# Patient Record
Sex: Male | Born: 1970 | Race: White | Hispanic: No | Marital: Married | Smoking: Current every day smoker
Health system: Southern US, Community
[De-identification: ages and names within clinical notes are randomized; demographics above are authoritative.]

## PROBLEM LIST (undated history)

## (undated) ENCOUNTER — Emergency Department (HOSPITAL_COMMUNITY): Payer: Self-pay | Source: Home / Self Care

## (undated) DIAGNOSIS — J45909 Unspecified asthma, uncomplicated: Secondary | ICD-10-CM

## (undated) DIAGNOSIS — F1910 Other psychoactive substance abuse, uncomplicated: Secondary | ICD-10-CM

## (undated) DIAGNOSIS — F1010 Alcohol abuse, uncomplicated: Secondary | ICD-10-CM

## (undated) DIAGNOSIS — R011 Cardiac murmur, unspecified: Secondary | ICD-10-CM

## (undated) DIAGNOSIS — F419 Anxiety disorder, unspecified: Secondary | ICD-10-CM

## (undated) HISTORY — PX: NO PAST SURGERIES: SHX2092

---

## 2000-05-23 ENCOUNTER — Emergency Department (HOSPITAL_COMMUNITY): Admission: EM | Admit: 2000-05-23 | Discharge: 2000-05-23 | Payer: Self-pay | Admitting: Emergency Medicine

## 2002-05-25 ENCOUNTER — Emergency Department (HOSPITAL_COMMUNITY): Admission: EM | Admit: 2002-05-25 | Discharge: 2002-05-25 | Payer: Self-pay | Admitting: Emergency Medicine

## 2002-07-15 ENCOUNTER — Ambulatory Visit (HOSPITAL_COMMUNITY): Admission: RE | Admit: 2002-07-15 | Discharge: 2002-07-15 | Payer: Self-pay | Admitting: Gastroenterology

## 2002-07-18 ENCOUNTER — Ambulatory Visit (HOSPITAL_COMMUNITY): Admission: RE | Admit: 2002-07-18 | Discharge: 2002-07-18 | Payer: Self-pay | Admitting: Internal Medicine

## 2002-07-18 ENCOUNTER — Encounter: Payer: Self-pay | Admitting: Internal Medicine

## 2007-03-06 ENCOUNTER — Emergency Department (HOSPITAL_COMMUNITY): Admission: EM | Admit: 2007-03-06 | Discharge: 2007-03-07 | Payer: Self-pay | Admitting: *Deleted

## 2009-11-05 ENCOUNTER — Emergency Department (HOSPITAL_COMMUNITY): Admission: EM | Admit: 2009-11-05 | Discharge: 2009-11-07 | Payer: Self-pay | Admitting: Emergency Medicine

## 2009-11-07 ENCOUNTER — Ambulatory Visit: Payer: Self-pay | Admitting: Psychiatry

## 2009-11-07 ENCOUNTER — Inpatient Hospital Stay (HOSPITAL_COMMUNITY): Admission: AD | Admit: 2009-11-07 | Discharge: 2009-11-10 | Payer: Self-pay | Admitting: Psychiatry

## 2010-06-01 LAB — ETHANOL: Alcohol, Ethyl (B): <5 mg/dL (ref 0–10)

## 2010-06-01 LAB — HEPATIC FUNCTION PANEL
ALT: 39 U/L (ref 0–53)
AST: 49 U/L — ABNORMAL HIGH (ref 0–37)
Alkaline Phosphatase: 73 U/L (ref 39–117)
Total Protein: 7 g/dL (ref 6.0–8.3)

## 2010-06-01 LAB — DIFFERENTIAL
Basophils Absolute: 0 10*3/uL (ref 0.0–0.1)
Basophils Relative: 1 % (ref 0–1)
Monocytes Relative: 7 % (ref 3–12)
Neutro Abs: 6.3 10*3/uL (ref 1.7–7.7)
Neutrophils Relative %: 62 % (ref 43–77)

## 2010-06-01 LAB — BASIC METABOLIC PANEL
Calcium: 10.1 mg/dL (ref 8.4–10.5)
GFR calc Af Amer: >60 mL/min (ref >60)
GFR calc non Af Amer: >60 mL/min (ref >60)
Sodium: 142 mEq/L (ref 135–145)

## 2010-06-01 LAB — CBC
Hemoglobin: 14.9 g/dL (ref 13.0–17.0)
MCHC: 35.3 g/dL (ref 30.0–36.0)
Platelets: 301 10*3/uL (ref 150–400)
RDW: 13 % (ref 11.5–15.5)

## 2010-06-01 LAB — TSH: TSH: 2.576 u[IU]/mL (ref 0.350–4.500)

## 2010-06-01 LAB — ACETAMINOPHEN LEVEL: Acetaminophen (Tylenol), Serum: <10 ug/mL — ABNORMAL LOW (ref 10–30)

## 2010-06-01 LAB — RAPID URINE DRUG SCREEN, HOSP PERFORMED
Barbiturates: NOT DETECTED
Cocaine: NOT DETECTED
Opiates: NOT DETECTED

## 2010-06-01 LAB — SALICYLATE LEVEL: Salicylate Lvl: <4 mg/dL (ref 2.8–20.0)

## 2010-08-03 NOTE — Op Note (Signed)
   NAME:  Derrick Meyer, Derrick Meyer                         ACCOUNT NO.:  [id number]   MEDICAL RECORD NO.:  [id number]                   PATIENT TYPE:  AMB   LOCATION:  ENDO                                 FACILITY:  [facility]   PHYSICIAN:  Danise Edge, M.D.                DATE OF BIRTH:  1970-09-08   DATE OF PROCEDURE:  07/15/2002  DATE OF DISCHARGE:                                 OPERATIVE REPORT   REFERRING PHYSICIAN:  Ike Bene, M.D.   PROCEDURE PERFORMED:  Colonoscopy.   ENDOSCOPIST:  Charolett Bumpers, M.D.   INDICATIONS FOR PROCEDURE:  The patient is a 40 year old male born [birth year].  The patient is undergoing diagnostic colonoscopy to evaluate  guaiac positive stool associated with a normal hemoglobin.   PREMEDICATION:  Versed 10 mg, Demerol 50 mg.   DESCRIPTION OF PROCEDURE:  After obtaining informed consent, the patient was  placed in the left lateral decubitus position.  I administered intravenous  Demerol and intravenous Versed to achieve conscious sedation for the  procedure.  The patient's blood pressure, oxygen saturations and cardiac  rhythm were monitored throughout the procedure and documented in the medical  record.   Anal inspection was normal.  Digital rectal exam was normal.  The Olympus  adult colonoscope was introduced into the rectum and easily advanced to  cecum.  A normal-appearing ileocecal valve was intubated and the distal  ileum inspected.  Colonic preparation for the exam today was excellent.   Rectum:  Normal.   Sigmoid colon and descending colon:  Normal.   Splenic flexure:  Normal.   Transverse colon:  Normal.   Hepatic flexure:  Normal.   Ascending colon:  Normal.   Cecum and ileocecal valve:  Normal.   Distal ileum:  Normal.   ASSESSMENT:  Normal proctocolonoscopy including inspection of the distal  ileum.                                                   Danise Edge, M.D.    MJ/MEDQ  D:  07/15/2002  T:   07/15/2002  Job:  [id number]

## 2020-01-26 ENCOUNTER — Observation Stay (HOSPITAL_COMMUNITY)
Admission: EM | Admit: 2020-01-26 | Discharge: 2020-01-27 | Disposition: A | Discharge status: Home / Self Care | Payer: 59 | Attending: Internal Medicine | Admitting: Internal Medicine

## 2020-01-26 ENCOUNTER — Emergency Department (HOSPITAL_COMMUNITY): Payer: 59

## 2020-01-26 ENCOUNTER — Encounter (HOSPITAL_COMMUNITY): Payer: Self-pay

## 2020-01-26 DIAGNOSIS — R0789 Other chest pain: Principal | ICD-10-CM | POA: Insufficient documentation

## 2020-01-26 DIAGNOSIS — E785 Hyperlipidemia, unspecified: Secondary | ICD-10-CM | POA: Diagnosis not present

## 2020-01-26 DIAGNOSIS — R9431 Abnormal electrocardiogram [ECG] [EKG]: Secondary | ICD-10-CM | POA: Diagnosis not present

## 2020-01-26 DIAGNOSIS — J45909 Unspecified asthma, uncomplicated: Secondary | ICD-10-CM | POA: Insufficient documentation

## 2020-01-26 DIAGNOSIS — F1010 Alcohol abuse, uncomplicated: Secondary | ICD-10-CM | POA: Diagnosis not present

## 2020-01-26 DIAGNOSIS — R079 Chest pain, unspecified: Secondary | ICD-10-CM | POA: Diagnosis not present

## 2020-01-26 DIAGNOSIS — Z7289 Other problems related to lifestyle: Secondary | ICD-10-CM

## 2020-01-26 DIAGNOSIS — F1090 Alcohol use, unspecified, uncomplicated: Secondary | ICD-10-CM

## 2020-01-26 DIAGNOSIS — Z20822 Contact with and (suspected) exposure to covid-19: Secondary | ICD-10-CM | POA: Diagnosis not present

## 2020-01-26 DIAGNOSIS — F17210 Nicotine dependence, cigarettes, uncomplicated: Secondary | ICD-10-CM | POA: Diagnosis not present

## 2020-01-26 DIAGNOSIS — I10 Essential (primary) hypertension: Secondary | ICD-10-CM | POA: Diagnosis not present

## 2020-01-26 DIAGNOSIS — Z79899 Other long term (current) drug therapy: Secondary | ICD-10-CM | POA: Insufficient documentation

## 2020-01-26 DIAGNOSIS — Z720 Tobacco use: Secondary | ICD-10-CM

## 2020-01-26 DIAGNOSIS — I160 Hypertensive urgency: Secondary | ICD-10-CM

## 2020-01-26 DIAGNOSIS — Z789 Other specified health status: Secondary | ICD-10-CM

## 2020-01-26 DIAGNOSIS — F419 Anxiety disorder, unspecified: Secondary | ICD-10-CM | POA: Diagnosis not present

## 2020-01-26 HISTORY — DX: Anxiety disorder, unspecified: F41.9

## 2020-01-26 HISTORY — DX: Cardiac murmur, unspecified: R01.1

## 2020-01-26 HISTORY — DX: Alcohol abuse, uncomplicated: F10.10

## 2020-01-26 HISTORY — DX: Other psychoactive substance abuse, uncomplicated: F19.10

## 2020-01-26 HISTORY — DX: Unspecified asthma, uncomplicated: J45.909

## 2020-01-26 LAB — COMPREHENSIVE METABOLIC PANEL
ALT: 30 U/L (ref 0–44)
AST: 42 U/L — ABNORMAL HIGH (ref 15–41)
Albumin: 4.2 g/dL (ref 3.5–5.0)
Alkaline Phosphatase: 78 U/L (ref 38–126)
Anion gap: 10 (ref 5–15)
BUN: 9 mg/dL (ref 6–20)
CO2: 27 mmol/L (ref 22–32)
Calcium: 9.2 mg/dL (ref 8.9–10.3)
Chloride: 102 mmol/L (ref 98–111)
Creatinine, Ser: 0.92 mg/dL (ref 0.61–1.24)
GFR, Estimated: >60 mL/min (ref >60)
Glucose, Bld: 95 mg/dL (ref 70–99)
Potassium: 3.4 mmol/L — ABNORMAL LOW (ref 3.5–5.1)
Sodium: 139 mmol/L (ref 135–145)
Total Bilirubin: 1.2 mg/dL (ref 0.3–1.2)
Total Protein: 8.1 g/dL (ref 6.5–8.1)

## 2020-01-26 LAB — BASIC METABOLIC PANEL
Anion gap: 14 (ref 5–15)
BUN: 8 mg/dL (ref 6–20)
CO2: 25 mmol/L (ref 22–32)
Calcium: 9.6 mg/dL (ref 8.9–10.3)
Chloride: 99 mmol/L (ref 98–111)
Creatinine, Ser: 0.82 mg/dL (ref 0.61–1.24)
GFR, Estimated: >60 mL/min (ref >60)
Glucose, Bld: 125 mg/dL — ABNORMAL HIGH (ref 70–99)
Potassium: 3.9 mmol/L (ref 3.5–5.1)
Sodium: 138 mmol/L (ref 135–145)

## 2020-01-26 LAB — CBC
HCT: 47.5 % (ref 39.0–52.0)
HCT: 47 % (ref 39.0–52.0)
HCT: 45.8 % (ref 39.0–52.0)
Hemoglobin: 16.4 g/dL (ref 13.0–17.0)
Hemoglobin: 16 g/dL (ref 13.0–17.0)
Hemoglobin: 15.8 g/dL (ref 13.0–17.0)
MCH: 33.7 pg (ref 26.0–34.0)
MCH: 33.7 pg (ref 26.0–34.0)
MCH: 33.9 pg (ref 26.0–34.0)
MCHC: 34.5 g/dL (ref 30.0–36.0)
MCHC: 34 g/dL (ref 30.0–36.0)
MCHC: 34.5 g/dL (ref 30.0–36.0)
MCV: 97.7 fL (ref 80.0–100.0)
MCV: 98.9 fL (ref 80.0–100.0)
MCV: 98.3 fL (ref 80.0–100.0)
Platelets: 285 10*3/uL (ref 150–400)
Platelets: 270 10*3/uL (ref 150–400)
Platelets: 277 10*3/uL (ref 150–400)
RBC: 4.86 MIL/uL (ref 4.22–5.81)
RBC: 4.75 MIL/uL (ref 4.22–5.81)
RBC: 4.66 MIL/uL (ref 4.22–5.81)
RDW: 12.1 % (ref 11.5–15.5)
RDW: 12.2 % (ref 11.5–15.5)
RDW: 12.1 % (ref 11.5–15.5)
WBC: 8.9 10*3/uL (ref 4.0–10.5)
WBC: 8 10*3/uL (ref 4.0–10.5)
WBC: 8.8 10*3/uL (ref 4.0–10.5)
nRBC: 0 % (ref 0.0–0.2)
nRBC: 0 % (ref 0.0–0.2)
nRBC: 0 % (ref 0.0–0.2)

## 2020-01-26 LAB — LIPID PANEL
Cholesterol: 218 mg/dL — ABNORMAL HIGH (ref 0–200)
HDL: 67 mg/dL (ref >40)
LDL Cholesterol: 135 mg/dL — ABNORMAL HIGH (ref 0–99)
Total CHOL/HDL Ratio: 3.3 RATIO
Triglycerides: 80 mg/dL (ref <150)
VLDL: 16 mg/dL (ref 0–40)

## 2020-01-26 LAB — HEPATIC FUNCTION PANEL
ALT: 29 U/L (ref 0–44)
AST: 27 U/L (ref 15–41)
Albumin: 4.5 g/dL (ref 3.5–5.0)
Alkaline Phosphatase: 90 U/L (ref 38–126)
Bilirubin, Direct: 0.1 mg/dL (ref 0.0–0.2)
Indirect Bilirubin: 0.8 mg/dL (ref 0.3–0.9)
Total Bilirubin: 0.9 mg/dL (ref 0.3–1.2)
Total Protein: 8.7 g/dL — ABNORMAL HIGH (ref 6.5–8.1)

## 2020-01-26 LAB — RESPIRATORY PANEL BY RT PCR (FLU A&B, COVID)
Influenza A by PCR: NEGATIVE
Influenza B by PCR: NEGATIVE
SARS Coronavirus 2 by RT PCR: NEGATIVE

## 2020-01-26 LAB — HEMOGLOBIN A1C
Hgb A1c MFr Bld: 5.1 % (ref 4.8–5.6)
Mean Plasma Glucose: 99.67 mg/dL

## 2020-01-26 LAB — MAGNESIUM: Magnesium: 2.3 mg/dL (ref 1.7–2.4)

## 2020-01-26 LAB — CREATININE, SERUM
Creatinine, Ser: 0.79 mg/dL (ref 0.61–1.24)
GFR, Estimated: >60 mL/min (ref >60)

## 2020-01-26 LAB — LIPASE, BLOOD: Lipase: 34 U/L (ref 11–51)

## 2020-01-26 LAB — PHOSPHORUS: Phosphorus: 3.2 mg/dL (ref 2.5–4.6)

## 2020-01-26 LAB — TROPONIN I (HIGH SENSITIVITY)
Troponin I (High Sensitivity): 26 ng/L — ABNORMAL HIGH (ref <18)
Troponin I (High Sensitivity): 24 ng/L — ABNORMAL HIGH (ref <18)

## 2020-01-26 MED ORDER — FOLIC ACID 1 MG PO TABS
1.0000 mg | ORAL_TABLET | Freq: Every day | ORAL | Status: DC
  Administered 2020-01-26 – 2020-01-27 (×2): 1 mg via ORAL
  Filled 2020-01-26 (×2): qty 1

## 2020-01-26 MED ORDER — ADULT MULTIVITAMIN W/MINERALS CH
1.0000 | ORAL_TABLET | Freq: Every day | ORAL | Status: DC
  Administered 2020-01-26 – 2020-01-27 (×2): 1 via ORAL
  Filled 2020-01-26 (×2): qty 1

## 2020-01-26 MED ORDER — ONDANSETRON HCL 4 MG/2ML IJ SOLN: 4.0000 mg | Freq: Four times a day (QID) | INTRAMUSCULAR | Status: DC | PRN

## 2020-01-26 MED ORDER — ALPRAZOLAM 0.25 MG PO TABS: 0.2500 mg | ORAL_TABLET | Freq: Two times a day (BID) | ORAL | Status: DC | PRN

## 2020-01-26 MED ORDER — CARVEDILOL 3.125 MG PO TABS
6.2500 mg | ORAL_TABLET | Freq: Two times a day (BID) | ORAL | Status: DC
  Administered 2020-01-26 – 2020-01-27 (×2): 6.25 mg via ORAL
  Filled 2020-01-26 (×2): qty 2

## 2020-01-26 MED ORDER — THIAMINE HCL 100 MG PO TABS
100.0000 mg | ORAL_TABLET | Freq: Every day | ORAL | Status: DC
  Administered 2020-01-26 – 2020-01-27 (×2): 100 mg via ORAL
  Filled 2020-01-26 (×2): qty 1

## 2020-01-26 MED ORDER — LIDOCAINE VISCOUS HCL 2 % MT SOLN
15.0000 mL | Freq: Once | OROMUCOSAL | Status: AC
Start: 2020-01-26 — End: 2020-01-26
  Administered 2020-01-26: 15 mL via ORAL
  Filled 2020-01-26: qty 15

## 2020-01-26 MED ORDER — ALUM & MAG HYDROXIDE-SIMETH 200-200-20 MG/5ML PO SUSP
30.0000 mL | Freq: Once | ORAL | Status: AC
Start: 2020-01-26 — End: 2020-01-26
  Administered 2020-01-26: 30 mL via ORAL
  Filled 2020-01-26: qty 30

## 2020-01-26 MED ORDER — LORAZEPAM 1 MG PO TABS
1.0000 mg | ORAL_TABLET | ORAL | Status: DC | PRN
  Filled 2020-01-26: qty 2

## 2020-01-26 MED ORDER — ACETAMINOPHEN 325 MG PO TABS: 650.0000 mg | ORAL_TABLET | ORAL | Status: DC | PRN

## 2020-01-26 MED ORDER — THIAMINE HCL 100 MG/ML IJ SOLN: 100.0000 mg | Freq: Every day | INTRAMUSCULAR | Status: DC

## 2020-01-26 MED ORDER — ENOXAPARIN SODIUM 40 MG/0.4ML ~~LOC~~ SOLN
40.0000 mg | SUBCUTANEOUS | Status: DC
  Administered 2020-01-26: 40 mg via SUBCUTANEOUS
  Filled 2020-01-26: qty 0.4

## 2020-01-26 MED ORDER — ASPIRIN 81 MG PO CHEW
162.0000 mg | CHEWABLE_TABLET | Freq: Once | ORAL | Status: AC
  Administered 2020-01-26: 162 mg via ORAL
  Filled 2020-01-26: qty 2

## 2020-01-26 MED ORDER — LORAZEPAM 2 MG/ML IJ SOLN: 1.0000 mg | INTRAMUSCULAR | Status: DC | PRN

## 2020-01-26 MED ORDER — PANTOPRAZOLE SODIUM 40 MG IV SOLR
40.0000 mg | Freq: Once | INTRAVENOUS | Status: AC
Start: 2020-01-26 — End: 2020-01-26
  Administered 2020-01-26: 40 mg via INTRAVENOUS
  Filled 2020-01-26: qty 40

## 2020-01-26 NOTE — H&P (Addendum)
History and Physical        Hospital Admission Note Date: 01/26/2020  Patient name: Derrick Meyer Medical record number: [id number] Date of birth: 12/30/1970 Age: 49 y.o. Gender: male  PCP: Pcp, No  Patient coming from: home   Chief Complaint    Chief Complaint  Patient presents with  . Chest Pain  . Weakness  . Shortness of Breath      HPI:   This is a very pleasant 49 year old male with past medical history of anxiety, alcohol abuse, tobacco use who presented to the ED on 11/10 with sudden onset left-sided chest pain with left arm weakness and shortness of breath.  Upon further questioning, patient states that he was already awake and working before his chest pain started.  He was not exerting himself and states he is never had this severe of chest pain and left arm numbness before.  No known personal history of cardiac issues or VTE and believes he has a family history of cardiac disease in his grandfather.    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  He takes Adderall which he has not been prescribed which helps him with work and Xanax which she is not prescribed to help with the anxiety.  He also drinks about 8 beers every other day and had his last drink yesterday which was only about 1 beer.  Also admits to smoking about 1 pack of cigarettes per week.  His stress level has been increasing and incredibly severe lately due to work.  He was working at the time of his chest pain onset.  Has occasional GERD symptoms as well.  ED Course: Afebrile, hypertensive on room air.  Notable labs: Troponin 26-> 24.  EKG with borderline ST depressions in V4-V6 and II, III and aVF (no prior to compare to).  CXR unremarkable.  ED provider discussed with Dr. Cristal Deer, cardiology, who recommended trending  troponins and echo and cardio will see tomorrow to determine further intervention.  He was given a GI cocktail and aspirin 162 mg  Vitals:   01/26/20 1500 01/26/20 1627  BP: (!) 142/91 (!) 134/102  Pulse: 65 76  Resp: 19 20  Temp:    SpO2: 99% 99%     Review of Systems:  Review of Systems  Constitutional: Negative for chills and fever.  Psychiatric/Behavioral: Negative for hallucinations. The patient is nervous/anxious.   All other systems reviewed and are negative.   Medical/Social/Family History   Past Medical History: Past Medical History:  Diagnosis Date  . Anxiety   . Asthma   . Heart murmur     Past Surgical History:  Procedure Laterality Date  . NO PAST SURGERIES      Medications: Prior to Admission medications   Medication Sig Start Date End Date Taking? Authorizing Provider  calcium carbonate (TUMS EX) 750 MG chewable tablet Chew 2 tablets by mouth as needed for heartburn.   Yes [provider]  cetirizine (ZYRTEC) 10 MG tablet Take 10 mg by mouth daily as needed for allergies.   Yes [provider]    Allergies:  No Known Allergies  Social History:  reports that he has been smoking cigarettes. He has a 2.50 pack-year smoking history. His smokeless tobacco use includes snuff. He reports current alcohol use. He reports previous drug use.  Family History: History reviewed. No pertinent family history.   Objective   Physical Exam: Blood pressure (!) 134/102, pulse 76, temperature 97.7 F (36.5 C), temperature source Oral, resp. rate 20, SpO2 99 %.  Physical Exam Vitals and nursing note reviewed.  Constitutional:      Appearance: Normal appearance.  HENT:     Head: Normocephalic and atraumatic.  Eyes:     Conjunctiva/sclera: Conjunctivae normal.  Cardiovascular:     Rate and Rhythm: Normal rate and regular rhythm.  Pulmonary:     Effort: Pulmonary effort is normal.     Breath sounds: Normal breath sounds.  Abdominal:      General: Abdomen is flat.     Palpations: Abdomen is soft.  Musculoskeletal:        General: No swelling or tenderness.  Skin:    Coloration: Skin is not jaundiced or pale.  Neurological:     Mental Status: He is alert. Mental status is at baseline.  Psychiatric:        Attention and Perception: Attention normal.        Mood and Affect: Mood is anxious. Affect is tearful.     LABS on Admission: I have personally reviewed all the labs and imaging below    Basic Metabolic Panel: Recent Labs  Lab 01/26/20 0907  NA 138  K 3.9  CL 99  CO2 25  GLUCOSE 125*  BUN 8  CREATININE 0.82  CALCIUM 9.6   Liver Function Tests: Recent Labs  Lab 01/26/20 0907  AST 27  ALT 29  ALKPHOS 90  BILITOT 0.9  PROT 8.7*  ALBUMIN 4.5   Recent Labs  Lab 01/26/20 0907  LIPASE 34   No results for input(s): AMMONIA in the last 168 hours. CBC: Recent Labs  Lab 01/26/20 0907  WBC 8.9  HGB 16.4  HCT 47.5  MCV 97.7  PLT 285   Cardiac Enzymes: No results for input(s): CKTOTAL, CKMB, CKMBINDEX, TROPONINI in the last 168 hours. BNP: Invalid input(s): POCBNP CBG: No results for input(s): GLUCAP in the last 168 hours.  Radiological Exams on Admission:  DG Chest 2 View  Result Date: 01/26/2020 CLINICAL DATA:  Chest pain. EXAM: CHEST - 2 VIEW COMPARISON:  None. FINDINGS: The heart size and mediastinal contours are within normal limits. Both lungs are clear. No pneumothorax or pleural effusion is noted. The visualized skeletal structures are unremarkable. IMPRESSION: No active cardiopulmonary disease. Electronically Signed   By: Lupita Raider M.D.   On: 01/26/2020 09:43      EKG: Independently reviewed.    A & P   Principal Problem:   Chest pain Active Problems:   Tobacco use   Alcohol use   Anxiety   Hypertensive urgency   1. Atypical chest pain, anxiety vs unstable angina, resolved a. Left-sided chest pain occurring at work with minimal exertion and radiation to left arm  and relieved with aspirin b. Significant anxiety at bedside with tearful affect leading to self-medicating at home with Xanax and Adderall and alcohol as below - likely a major contributing factor c. Troponins flat x2 with abnormal EKG on personal read and no prior to compare to d. Hold heparin per cardiology recommendations e. Repeat EKG in a.m. f. Lipid panel g. HbA1c h. Nitro as needed i. Start  carvedilol given his hypertension as well j. Telemetry k. Echo l. Cardiology consulted, to see in a.m.  2. Anxiety a. Psychiatry consulted  3. Hypertensive urgency a. Start beta-blocker as above  4. Alcohol abuse a. Drinks 6-8 beers every other day, last drink yesterday b. Advised cessation c. CIWA protocol d. UDS  5. Tobacco use a. Smokes 1 pack/week b. Advised cessation   DVT prophylaxis: lovenox   Code Status: Not on file  Diet: heart healthy Family Communication: Admission, patients condition and plan of care including tests being ordered have been discussed with the patient who indicates understanding and agrees with the plan and Code Status.  Disposition Plan: The appropriate patient status for this patient is OBSERVATION. Observation status is judged to be reasonable and necessary in order to provide the required intensity of service to ensure the patient's safety. The patient's presenting symptoms, physical exam findings, and initial radiographic and laboratory data in the context of their medical condition is felt to place them at decreased risk for further clinical deterioration. Furthermore, it is anticipated that the patient will be medically stable for discharge from the hospital within 2 midnights of admission. The following factors support the patient status of observation.   " The patient's presenting symptoms include chest pain, anxiety. " The physical exam findings include anxiety. " The initial radiographic and laboratory data are unremarkable.    Status is:  Observation  The patient remains OBS appropriate and will d/c before 2 midnights.  Dispo: The patient is from: Home              Anticipated d/c is to: Home              Anticipated d/c date is: 1 day              Patient currently is not medically stable to d/c.    Consultants  . Cardiology . psychiatry  Procedures  . None  Time Spent on Admission: 66 minutes    Jae Dire, DO [facility]  01/26/2020, 5:03 PM

## 2020-01-26 NOTE — ED Triage Notes (Addendum)
Patient c/o left arm weakness, chest pain, and shob that started this morning.   6/10 pain    A/OX4

## 2020-01-26 NOTE — ED Provider Notes (Signed)
[facility]-EMERGENCY DEPT Provider Note   CSN: [id number] Arrival date & time: 01/26/20  5427     History Chief Complaint  Patient presents with  . Chest Pain  . Weakness  . Shortness of Breath    Derrick Meyer is a 49 y.o. male otherwise healthy no daily medication use.  Patient presents today for left-sided chest pain, left arm weakness and shortness of breath that woke him from his sleep at 7 AM this morning.  Patient denies similar illness in the past.  He describes left-sided chest discomfort/pressure nonradiating no aggravating or alleviating factors constant moderate intensity.  He attempted 1 Xanax and 2 baby aspirin this morning without relief of symptoms.  He reports feeling of generalized weakness to his entire body but somewhat worse in the left arm.  He reports shortness of breath as a feeling of difficulty catching his breath but no pain with deep breathing.  Of note patient reports a history of GERD but feels this pain is different than normal.  He reports frequent alcohol use and having a few beers last night before bed.  Denies fever/chills, fall/injury, headache, neck pain, cough/hemoptysis, recent illness, abdominal pain, nausea/vomiting, diarrhea, numbness/tingling, extremity swelling/color change, history of blood clot, recent travel/immobilization, recent surgeries, exogenous hormone use, history of cancer or any additional concerns. HPI     History reviewed. No pertinent past medical history.  Patient Active Problem List   Diagnosis Date Noted  . Chest pain 01/26/2020    History reviewed. No pertinent surgical history.     No family history on file.  Social History   Tobacco Use  . Smoking status: Not on file  Substance Use Topics  . Alcohol use: Not on file  . Drug use: Not on file    Home Medications Prior to Admission medications   Medication Sig Start Date End Date Taking? Authorizing Provider  calcium carbonate  (TUMS EX) 750 MG chewable tablet Chew 2 tablets by mouth as needed for heartburn.   Yes [provider]  cetirizine (ZYRTEC) 10 MG tablet Take 10 mg by mouth daily as needed for allergies.   Yes [provider]    Allergies    Patient has no known allergies.  Review of Systems   Review of Systems Ten systems are reviewed and are negative for acute change except as noted in the HPI  Physical Exam Updated Vital Signs BP (!) 155/99   Pulse 63   Temp 97.7 F (36.5 C) (Oral)   Resp 17   SpO2 99%   Physical Exam Constitutional:      General: He is not in acute distress.    Appearance: Normal appearance. He is well-developed. He is not ill-appearing or diaphoretic.  HENT:     Head: Normocephalic and atraumatic.  Eyes:     General: Vision grossly intact. Gaze aligned appropriately.     Pupils: Pupils are equal, round, and reactive to light.  Neck:     Trachea: Trachea and phonation normal.  Cardiovascular:     Rate and Rhythm: Normal rate and regular rhythm.     Pulses:          Radial pulses are 2+ on the right side and 2+ on the left side.       Dorsalis pedis pulses are 2+ on the right side and 2+ on the left side.  Pulmonary:     Effort: Pulmonary effort is normal. No respiratory distress.  Abdominal:  General: There is no distension.     Palpations: Abdomen is soft.     Tenderness: There is no abdominal tenderness. There is no guarding or rebound.  Musculoskeletal:        General: Normal range of motion.     Cervical back: Normal range of motion.     Right lower leg: No tenderness. No edema.     Left lower leg: No tenderness. No edema.  Skin:    General: Skin is warm and dry.  Neurological:     Mental Status: He is alert.     GCS: GCS eye subscore is 4. GCS verbal subscore is 5. GCS motor subscore is 6.     Comments: Speech is clear and goal oriented, follows commands Major Cranial nerves without deficit, no facial droop Normal strength in upper  and lower extremities bilaterally including dorsiflexion and plantar flexion, strong and equal grip strength Sensation normal to light and sharp touch Moves extremities without ataxia, coordination intact Normal finger to nose and rapid alternating movements No pronator drift Normal gait  Psychiatric:        Behavior: Behavior normal.     ED Results / Procedures / Treatments   Labs (all labs ordered are listed, but only abnormal results are displayed) Labs Reviewed  BASIC METABOLIC PANEL - Abnormal; Notable for the following components:      Result Value   Glucose, Bld 125 (*)    All other components within normal limits  HEPATIC FUNCTION PANEL - Abnormal; Notable for the following components:   Total Protein 8.7 (*)    All other components within normal limits  TROPONIN I (HIGH SENSITIVITY) - Abnormal; Notable for the following components:   Troponin I (High Sensitivity) 26 (*)    All other components within normal limits  TROPONIN I (HIGH SENSITIVITY) - Abnormal; Notable for the following components:   Troponin I (High Sensitivity) 24 (*)    All other components within normal limits  RESPIRATORY PANEL BY RT PCR (FLU A&B, COVID)  CBC  LIPASE, BLOOD    EKG EKG Interpretation  Date/Time:  Wednesday January 26 2020 09:00:11 EST Ventricular Rate:  102 PR Interval:    QRS Duration: 90 QT Interval:  397 QTC Calculation: 518 R Axis:   125 Text Interpretation: Sinus tachycardia Biatrial enlargement Left posterior fascicular block Abnormal R-wave progression, late transition Borderline ST depression, diffuse leads Prolonged QT interval 12 Lead; Mason-Likar Confirmed by Marianna Fuss ([id number]) on 01/26/2020 1:17:18 PM   Radiology DG Chest 2 View  Result Date: 01/26/2020 CLINICAL DATA:  Chest pain. EXAM: CHEST - 2 VIEW COMPARISON:  None. FINDINGS: The heart size and mediastinal contours are within normal limits. Both lungs are clear. No pneumothorax or pleural effusion is  noted. The visualized skeletal structures are unremarkable. IMPRESSION: No active cardiopulmonary disease. Electronically Signed   By: Lupita Raider M.D.   On: 01/26/2020 09:43    Procedures Procedures (including critical care time)  Medications Ordered in ED Medications  aspirin chewable tablet 162 mg (162 mg Oral Given 01/26/20 0948)  pantoprazole (PROTONIX) injection 40 mg (40 mg Intravenous Given 01/26/20 0949)  alum & mag hydroxide-simeth (MAALOX/MYLANTA) 200-200-20 MG/5ML suspension 30 mL (30 mLs Oral Given 01/26/20 0949)    And  lidocaine (XYLOCAINE) 2 % viscous mouth solution 15 mL (15 mLs Oral Given 01/26/20 0949)    ED Course  I have reviewed the triage vital signs and the nursing notes.  Pertinent labs & imaging results that were  available during my care of the patient were reviewed by me and considered in my medical decision making (see chart for details).  Clinical Course as of Jan 26 1432  Wed Jan 26, 2020  1348 Dr. Cristal Deer   [BM]  1349 Troponins, Echo, Cards to possibly CT tomorrow   [BM]  1401 Dr. Dairl Ponder   [BM]    Clinical Course User Index [BM] Elizabeth Palau   MDM Rules/Calculators/A&P                         Additional history obtained from: 1. Nursing notes from this visit. 2. Electronic medical record review.  No pertinent previous visits. ---------------------------- 49 year old male presents with chest pain shortness of breath and left arm weak sensation that began this morning.  On exam he is well-appearing no acute distress cranial nerves intact, no meningeal signs, cardiopulmonary exam unremarkable, abdomen soft nontender without peritoneal signs, neurovascular tact all 4 extremities without evidence of DVT.  He is low risk by Wells criteria and PERC negative.  He does have a history of GERD and frequent alcohol use but feels pain is different today.  Will obtain chest pain labs in addition to a lipase and LFTs.  Will obtain chest x-ray  EKG give additional 162 mg aspirin as well as GI cocktail/Protonix. - I ordered, reviewed and interpreted labs which include: High-sensitivity troponins minimally elevated at 26 and 24 BMP shows no emergent electrolyte derangement, AKI or gap. Lipase within normal limits, doubt pancreatitis. LFTs with no significant elevations, low suspicion for biliary disease. CBC without leukocytosis to suggest infection and no anemia. Covid/influenza panel pending  CXR:    IMPRESSION:  No active cardiopulmonary disease.   EKG: inus tachycardia Biatrial enlargement Left posterior fascicular block Abnormal R-wave progression, late transition Borderline ST depression, diffuse leads Prolonged QT interval 12 Lead; Mason-Likar Confirmed by Marianna Fuss ([id number]) on 01/26/2020 1:17:18 PM - Concern for possible cardiac cause of patient's chest pain today.  Low suspicion for dissection given history work-up and physical exam, additionally doubt PE as patient is low risk by Wells criteria and PERC negative, doubt infection, additionally abdomen soft nontender doubt perforation, pancreatitis, SBO or other emergent intra-abdominal pathologies.  Patient without previous cardiac work-up.  Discussed case with cardiologist Dr. Cristal Deer who advises admission to medicine service for trending of troponins and echocardiogram, cardiology to see tomorrow to decide if patient will need coronary CT, advises patient does not need heparin unless troponins are significantly elevated. - Discussed case with Dr. Dairl Ponder, patient accepted to hospitalist service. - Patient reassessed, he is resting comfortably no acute distress, chest pain-free.  Vital signs stable.  He states understanding of work-up today and is agreeable for admission.  Patient seen and evaluated by Dr. Stevie Kern during this visit.  Note: Portions of this report may have been transcribed using voice recognition software. Every effort was made to ensure accuracy;  however, inadvertent computerized transcription errors may still be present. Final Clinical Impression(s) / ED Diagnoses Final diagnoses:  Chest pain, unspecified type    Rx / DC Orders ED Discharge Orders    None       Elizabeth Palau 01/26/20 1433    Milagros Loll, MD 01/27/20 [phone]

## 2020-01-26 NOTE — ED Notes (Signed)
Hourly rounding complete. Pt resting comfortably with bed in lowest position and side rails up x2. Vitals updated. All needs met at this time. Will continue to monitor. 

## 2020-01-27 ENCOUNTER — Observation Stay (HOSPITAL_BASED_OUTPATIENT_CLINIC_OR_DEPARTMENT_OTHER): Payer: 59

## 2020-01-27 ENCOUNTER — Encounter (HOSPITAL_COMMUNITY): Payer: Self-pay | Admitting: Internal Medicine

## 2020-01-27 ENCOUNTER — Observation Stay (HOSPITAL_COMMUNITY): Payer: 59

## 2020-01-27 DIAGNOSIS — I10 Essential (primary) hypertension: Secondary | ICD-10-CM | POA: Diagnosis not present

## 2020-01-27 DIAGNOSIS — Z720 Tobacco use: Secondary | ICD-10-CM | POA: Diagnosis not present

## 2020-01-27 DIAGNOSIS — R778 Other specified abnormalities of plasma proteins: Secondary | ICD-10-CM

## 2020-01-27 DIAGNOSIS — Z79899 Other long term (current) drug therapy: Secondary | ICD-10-CM | POA: Diagnosis not present

## 2020-01-27 DIAGNOSIS — F17210 Nicotine dependence, cigarettes, uncomplicated: Secondary | ICD-10-CM | POA: Diagnosis not present

## 2020-01-27 DIAGNOSIS — R9431 Abnormal electrocardiogram [ECG] [EKG]: Secondary | ICD-10-CM | POA: Diagnosis not present

## 2020-01-27 DIAGNOSIS — E785 Hyperlipidemia, unspecified: Secondary | ICD-10-CM | POA: Diagnosis not present

## 2020-01-27 DIAGNOSIS — J45909 Unspecified asthma, uncomplicated: Secondary | ICD-10-CM | POA: Diagnosis not present

## 2020-01-27 DIAGNOSIS — R079 Chest pain, unspecified: Secondary | ICD-10-CM | POA: Diagnosis not present

## 2020-01-27 DIAGNOSIS — R0789 Other chest pain: Secondary | ICD-10-CM | POA: Diagnosis present

## 2020-01-27 DIAGNOSIS — Z20822 Contact with and (suspected) exposure to covid-19: Secondary | ICD-10-CM | POA: Diagnosis not present

## 2020-01-27 DIAGNOSIS — F1010 Alcohol abuse, uncomplicated: Secondary | ICD-10-CM | POA: Diagnosis not present

## 2020-01-27 DIAGNOSIS — Z7289 Other problems related to lifestyle: Secondary | ICD-10-CM | POA: Diagnosis not present

## 2020-01-27 LAB — RAPID URINE DRUG SCREEN, HOSP PERFORMED
Amphetamines: POSITIVE — AB
Barbiturates: NOT DETECTED
Benzodiazepines: POSITIVE — AB
Cocaine: NOT DETECTED
Opiates: NOT DETECTED
Tetrahydrocannabinol: POSITIVE — AB

## 2020-01-27 LAB — HIV ANTIBODY (ROUTINE TESTING W REFLEX): HIV Screen 4th Generation wRfx: NONREACTIVE

## 2020-01-27 LAB — ECHOCARDIOGRAM COMPLETE
Area-P 1/2: 3.12 cm2
S' Lateral: 2.8 cm

## 2020-01-27 MED ORDER — DILTIAZEM HCL 30 MG PO TABS
30.0000 mg | ORAL_TABLET | Freq: Once | ORAL | Status: AC
  Administered 2020-01-27: 30 mg via ORAL
  Filled 2020-01-27: qty 1

## 2020-01-27 MED ORDER — THIAMINE HCL 100 MG PO TABS: 100.0000 mg | ORAL_TABLET | Freq: Every day | ORAL | 1 refills | Status: AC

## 2020-01-27 MED ORDER — METOPROLOL TARTRATE 5 MG/5ML IV SOLN
5.0000 mg | INTRAVENOUS | Status: DC | PRN
  Administered 2020-01-27: 5 mg via INTRAVENOUS

## 2020-01-27 MED ORDER — CARVEDILOL 6.25 MG PO TABS: 6.2500 mg | ORAL_TABLET | Freq: Two times a day (BID) | ORAL | 1 refills | Status: AC

## 2020-01-27 MED ORDER — ASPIRIN EC 81 MG PO TBEC
81.0000 mg | DELAYED_RELEASE_TABLET | Freq: Every day | ORAL | Status: DC
  Administered 2020-01-27: 81 mg via ORAL
  Filled 2020-01-27: qty 1

## 2020-01-27 MED ORDER — POTASSIUM CHLORIDE CRYS ER 20 MEQ PO TBCR
40.0000 meq | EXTENDED_RELEASE_TABLET | Freq: Once | ORAL | Status: AC
  Administered 2020-01-27: 40 meq via ORAL
  Filled 2020-01-27: qty 2

## 2020-01-27 MED ORDER — IOHEXOL 350 MG/ML SOLN
80.0000 mL | Freq: Once | INTRAVENOUS | Status: AC | PRN
  Administered 2020-01-27: 80 mL via INTRAVENOUS

## 2020-01-27 MED ORDER — ASPIRIN 81 MG PO TBEC: 81.0000 mg | DELAYED_RELEASE_TABLET | Freq: Every day | ORAL | 1 refills | Status: AC

## 2020-01-27 MED ORDER — NITROGLYCERIN 0.4 MG SL SUBL
0.8000 mg | SUBLINGUAL_TABLET | Freq: Once | SUBLINGUAL | Status: AC
  Administered 2020-01-27: 0.8 mg via SUBLINGUAL

## 2020-01-27 MED ORDER — ATORVASTATIN CALCIUM 20 MG PO TABS: 20.0000 mg | ORAL_TABLET | Freq: Every day | ORAL | 1 refills | Status: AC

## 2020-01-27 NOTE — Consult Note (Signed)
Cardiology Consultation:   Patient ID: Derrick Meyer MRN: [id number]; DOB: 07-Jan-1971  Admit date: 01/26/2020 Date of Consult: 01/27/2020  Primary Care Provider: Oneita Hurt No [facility] Cardiologist: Jodelle Red, MD (new) [facility] Electrophysiologist:  None    Patient Profile:   Derrick Meyer is a 49 y.o. male with a hx of anxiety with report benzodiazepine overdose, alcohol abuse, tobacco abuse, prior cocaine use (denies active use), asthma who is being seen today for the evaluation of chest pain/abnormal EKG at the request of Dr. Dairl Ponder.  History of Present Illness:   [name] reports a prior echo 15 years ago for heart murmur that looked OK, no records in Epic, otherwise no significant cardiac hx. He has been under a lot of stress recently with work and not sleeping well. Yesterday around 7am while at rest, he developed substernal chest pain radiating to left arm associated with SOB. He waited out the symptoms but they did not improve so he came to the ED around 9am. Here he reports he had about 4-5 total hours of pain with that episode. He received 162mg  ASA and GI cocktail. The pain eventually subsided. He still has very mild transient waves at times but nothing like the original event. He reports previous hx of brief discomfort attributed to anxiety in the past. He drinks 8 beers every other day, + tobacco use. Uses non-prescribed Adderall and Xanax per H/P. Reports hx of remote cocaine use several years ago but denies recent use. EKG yesterday showed sinus tachycardia 102bpm, biatrial enlargement, diffuse ST depression inferiorly and V5-V6 without prior to compare to. He was started on carvedilol and CIWA protocol. F/u EKG tracing this morning shows improvement of prior ST changes. VS showed HTN up to max SBP 156, max DBP 103, improved this AM. Labs show mild hyperkalemia, LDL 135, hsTroponin 26->24, AST mildly abnormal, CBC wnl, Covid screen negative, UDS ordered this AM  and pending. CXR NAD.    Past Medical History:  Diagnosis Date  . Alcohol abuse   . Anxiety   . Asthma   . Heart murmur   . Polysubstance abuse [facility])     Past Surgical History:  Procedure Laterality Date  . NO PAST SURGERIES       Home Medications:  Prior to Admission medications   Medication Sig Start Date End Date Taking? Authorizing Provider  calcium carbonate (TUMS EX) 750 MG chewable tablet Chew 2 tablets by mouth as needed for heartburn.   Yes [provider]  cetirizine (ZYRTEC) 10 MG tablet Take 10 mg by mouth daily as needed for allergies.   Yes [provider]    Inpatient Medications: Scheduled Meds: . aspirin EC  81 mg Oral Daily  . carvedilol  6.25 mg Oral BID WC  . enoxaparin (LOVENOX) injection  40 mg Subcutaneous Q24H  . folic acid  1 mg Oral Daily  . multivitamin with minerals  1 tablet Oral Daily  . potassium chloride  40 mEq Oral Once  . thiamine  100 mg Oral Daily   Or  . thiamine  100 mg Intravenous Daily   Continuous Infusions:  PRN Meds: acetaminophen, ALPRAZolam, LORazepam **OR** LORazepam, ondansetron (ZOFRAN) IV  Allergies:   No Known Allergies  Social History:   Social History   Socioeconomic History  . Marital status: Married    Spouse name: Not on file  . Number of children: Not on file  . Years of education: Not on file  . Highest education level:  Not on file  Occupational History  . Not on file  Tobacco Use  . Smoking status: Current Every Day Smoker    Packs/day: 0.25    Years: 10.00    Pack years: 2.50    Types: Cigarettes  . Smokeless tobacco: Current User    Types: Snuff  Vaping Use  . Vaping Use: Never used  Substance and Sexual Activity  . Alcohol use: Yes    Comment: 8 beers every other day  . Drug use: Not Currently  . Sexual activity: Not on file  Other Topics Concern  . Not on file  Social History Narrative  . Not on file   Social Determinants of Health   Financial Resource Strain:    . Difficulty of Paying Living Expenses: Not on file  Food Insecurity:   . Worried About Programme researcher, broadcasting/film/video in the Last Year: Not on file  . Ran Out of Food in the Last Year: Not on file  Transportation Needs:   . Lack of Transportation (Medical): Not on file  . Lack of Transportation (Non-Medical): Not on file  Physical Activity:   . Days of Exercise per Week: Not on file  . Minutes of Exercise per Session: Not on file  Stress:   . Feeling of Stress : Not on file  Social Connections:   . Frequency of Communication with Friends and Family: Not on file  . Frequency of Social Gatherings with Friends and Family: Not on file  . Attends Religious Services: Not on file  . Active Member of Clubs or Organizations: Not on file  . Attends Banker Meetings: Not on file  . Marital Status: Not on file  Intimate Partner Violence:   . Fear of Current or Ex-Partner: Not on file  . Emotionally Abused: Not on file  . Physically Abused: Not on file  . Sexually Abused: Not on file    Family History:    Family History  Problem Relation Age of Onset  . Breast cancer Maternal Grandmother   . CAD Neg Hx      ROS:  Please see the history of present illness.   All other ROS reviewed and negative.     Physical Exam/Data:   Vitals:   01/27/20 0843 01/27/20 0845 01/27/20 0900 01/27/20 0915  BP: 110/70 110/70 129/87 127/88  Pulse: 68 75 69 67  Resp: (!) 25 14 13 18   Temp: 98 F (36.7 C)     TempSrc: Oral     SpO2: 99% 100% 98% 99%   No intake or output data in the 24 hours ending 01/27/20 0942 No flowsheet data found.    General: Well developed, well nourished WM, in no acute distress. Head: Normocephalic, atraumatic, sclera non-icteric, no xanthomas, nares are without discharge. Neck: Negative for carotid bruits. JVP not elevated. Lungs: Clear bilaterally to auscultation without wheezes, rales, or rhonchi. Breathing is unlabored. Heart: RRR S1 S2 without murmurs, rubs, or  gallops.  Abdomen: Soft, non-tender, non-distended with normoactive bowel sounds. No rebound/guarding. Extremities: No clubbing or cyanosis. No edema. Distal pedal pulses are 2+ and equal bilaterally. Neuro: Alert and oriented X 3. Moves all extremities spontaneously. Psych:  Responds to questions appropriately with a normal affect.   EKG:  The EKG was personally reviewed and demonstrates:   Yesterday: Sinus tachycardia 102bpm, biatrial enlargement, LFPB, diffuse ST depression inferiorly and V5-V6 without prior to compare to  Telemetry:  Telemetry was personally reviewed and demonstrates:  NSR Relevant CV Studies:  N/A  Laboratory Data:  High Sensitivity Troponin:   Recent Labs  Lab 01/26/20 0907 01/26/20 1107  TROPONINIHS 26* 24*     Chemistry Recent Labs  Lab 01/26/20 0907 01/26/20 1652 01/26/20 2017  NA 138 139  --   K 3.9 3.4*  --   CL 99 102  --   CO2 25 27  --   GLUCOSE 125* 95  --   BUN 8 9  --   CREATININE 0.82 0.92 0.79  CALCIUM 9.6 9.2  --   GFRNONAA >60 >60 >60  ANIONGAP 14 10  --     Recent Labs  Lab 01/26/20 0907 01/26/20 1652  PROT 8.7* 8.1  ALBUMIN 4.5 4.2  AST 27 42*  ALT 29 30  ALKPHOS 90 78  BILITOT 0.9 1.2   Hematology Recent Labs  Lab 01/26/20 0907 01/26/20 1652 01/26/20 2017  WBC 8.9 8.0 8.8  RBC 4.86 4.75 4.66  HGB 16.4 16.0 15.8  HCT 47.5 47.0 45.8  MCV 97.7 98.9 98.3  MCH 33.7 33.7 33.9  MCHC 34.5 34.0 34.5  RDW 12.1 12.2 12.1  PLT 285 270 277   BNPNo results for input(s): BNP, PROBNP in the last 168 hours.  DDimer No results for input(s): DDIMER in the last 168 hours.   Radiology/Studies:  DG Chest 2 View  Result Date: 01/26/2020 CLINICAL DATA:  Chest pain. EXAM: CHEST - 2 VIEW COMPARISON:  None. FINDINGS: The heart size and mediastinal contours are within normal limits. Both lungs are clear. No pneumothorax or pleural effusion is noted. The visualized skeletal structures are unremarkable. IMPRESSION: No active  cardiopulmonary disease. Electronically Signed   By: Lupita Raider M.D.   On: 01/26/2020 09:43     Assessment and Plan:   1. Chest pain with features concerning for transient unstable angina - EKG showed dynamic changes with ST depression on admission but surprisingly hsTroponin was minimally elevated and flat - drug screen pending - given tox history, would be concerned for recurrent cocaine use; could also represent vasospasm from amphetamine action of Adderall - start ASA 81mg  daily - has already been receiving beta blocker in the form of carvedilol - if + cocaine would cease BB and use amlodipine instead - await drug screen - per preliminary discussion with Dr. , LVEF is normal so will plan on coronary CTA today. HR 60s following carvedilol. Since UDS is still pending will use diltiazem 30mg  x 1 now instead of metoprolol to bring HR closer to 50s. Coordinated with CT coordinator's help  2. ETOH abuse, also non-prescription use of Adderall and Xanax - psych consult was placed per notes due to pt self-medicating for anxiety  3. Elevated blood pressure without prior history of HTN - carvedilol started, see comments above  4. Hypokalemia - give KCl Cristal Deer now, BMET in AM      HEAR Score (for undifferentiated chest pain):  HEAR Score: 4            For questions or updates, please contact [facility] Please consult [email or web address] for contact info under    Signed, , PA-C  01/27/2020 9:42 AM

## 2020-01-27 NOTE — Progress Notes (Signed)
Patient ID: Derrick Meyer, male   DOB: 13-Feb-1971, 49 y.o.   MRN: [id number] Derrick Meyer is a 49 year old male with a past medical history of anxiety, alcohol abuse, tobacco abuse who presented to the ED on 11/10 with c/o chest pain. He reports recent stressors from work, and has been self medicating with Adderall and xanax both in which he is not prescribed. He has been drinking about 8 beers daily. BAL and urine tox studies are not available at this time. Psych consult placed for severe anxiety leading to self medicating. Patient remains in the ED at this time, unable to assess. If patient is to remain in the ED please place TTS consult. He will also benefit from Peer support consult for substance abuse and facilities in the area.

## 2020-01-27 NOTE — ED Notes (Signed)
Called report to Andres Ege with [facility]

## 2020-01-27 NOTE — Progress Notes (Addendum)
12:50p  - pt arrived to [facility] CT 1 for cardiac CTA , HR  67  12:55p - pt given 0.8mg  SL NTG for optimal vasodilation  13:00p - pt HR up to 92bpm, pale diaphoretic and nauseated. Checked BP 105/79. Pt stated he was starting to feel better.  13:05 - 5mg  metoprolol tartrate IVP given for HR control. HR 59 during image acquisition.   13:10 - BP after scan 110/80. Pt ambulatory from scanner table to [facility] stretcher steady gait.   Christopher, B. Made aware.  RN Navigator Cardiac Imaging [facility] [phone] Office  [phone] Cell

## 2020-01-27 NOTE — ED Notes (Signed)
Pt back at [facility] from [facility]

## 2020-01-27 NOTE — ED Notes (Addendum)
Pt refused 2 mg PO Ativan per CIWA assessment. Pt is adamant that he wants to leave. I have not heard from the medical team for the next steps in pt's care plan. Pt stated "if you come back and I am not here, you know I left". This nurse advised against leaving AMA without removal of both peripheral IV's. Pt stated "Oh I can take care of them, I have ripped them out before."

## 2020-01-27 NOTE — ED Notes (Signed)
Pt had small sips of water w/ morning meds

## 2020-01-27 NOTE — ED Notes (Signed)
[facility] @ bedside 

## 2020-01-27 NOTE — Progress Notes (Signed)
[facility] PROGRESS NOTE   Derrick Meyer Derrick Meyer DOB: 1971-02-19 DOA: 01/26/2020  PCP: Pcp, No  Brief History/Interval Summary: 49 year old male with past medical history of anxiety, alcohol abuse, tobacco use who presented to the ED on 11/10 with sudden onset left-sided chest pain with left arm weakness and shortness of breath.  Found to have nonspecific changes on EKG with minimally elevated troponin.  Was hospitalized for further management.   Consultants: Cardiology  Procedures: Echocardiogram  Antibiotics: Anti-infectives (From admission, onward)   None      Subjective/Interval History: Patient mentions 2 out of 10 chest pain on the left side.  Described as a dull achy pain.  No radiation currently.  Denies any shortness of breath.     Assessment/Plan:  Chest pain with nonspecific EKG changes/elevated blood pressure Patient with minimal troponin elevation.  Continues to have chest pain at this time.  EKG showed some ST depression in the lateral leads.  Patient apparently with previous history of cocaine abuse.  Urine drug screen is pending.  Cardiology consulted.  Plan is for Coronary CT angiogram later today.  Echocardiogram also has been ordered.  LDL noted to be 135.  Patient was noted to have elevated blood pressure at the time of initial presentation.  Patient was started on carvedilol.  Blood pressure noted to be normal this morning.  History of anxiety Psychiatry was consulted.  Patient apparently has been self-medicating.  Currently on as needed alprazolam.  History of alcohol abuse Drinks 6-8 beers every other day.  Last drink was about 2 days ago.  No evidence for withdrawal symptoms currently.  Continue CIWA protocol.  History of tobacco abuse Cessation counseling was provided.   DVT Prophylaxis: Lovenox Code Status: Full code Family Communication: Discussed with the patient Disposition Plan: Hopefully home when improved  Status is:  Observation  The patient remains OBS appropriate and will d/c before 2 midnights.  Dispo: The patient is from: Home              Anticipated d/c is to: Home              Anticipated d/c date is: 1 day              Patient currently is not medically stable to d/c.       Medications:  Scheduled: . aspirin EC  81 mg Oral Daily  . carvedilol  6.25 mg Oral BID WC  . enoxaparin (LOVENOX) injection  40 mg Subcutaneous Q24H  . folic acid  1 mg Oral Daily  . multivitamin with minerals  1 tablet Oral Daily  . thiamine  100 mg Oral Daily   Or  . thiamine  100 mg Intravenous Daily   Continuous:  JAS:[id number], ALPRAZolam, LORazepam **OR** LORazepam, ondansetron (ZOFRAN) IV   Objective:  Vital Signs  Vitals:   01/27/20 0930 01/27/20 0945 01/27/20 1000 01/27/20 1058  BP:   (!) 125/96 130/85  Pulse: 67 69 (!) 44 (!) 59  Resp: 15 15 15 13   Temp:    98 F (36.7 C)  TempSrc:    Oral  SpO2: 98% 96% 96% 97%   No intake or output data in the 24 hours ending 01/27/20 1146 There were no vitals filed for this visit.  General appearance: Awake alert.  In no distress Resp: Clear to auscultation bilaterally.  Normal effort Cardio: S1-S2 is normal regular.  No S3-S4.  No rubs murmurs or bruit GI: Abdomen is soft.  Nontender nondistended.  Bowel sounds are present normal.  No masses organomegaly Extremities: No edema.  Full range of motion of lower extremities. Neurologic: Alert and oriented x3.  No focal neurological deficits.    Lab Results:  Data Reviewed: I have personally reviewed following labs and imaging studies  CBC: Recent Labs  Lab 01/26/20 0907 01/26/20 1652 01/26/20 2017  WBC 8.9 8.0 8.8  HGB 16.4 16.0 15.8  HCT 47.5 47.0 45.8  MCV 97.7 98.9 98.3  PLT 285 270 277    Basic Metabolic Panel: Recent Labs  Lab 01/26/20 0907 01/26/20 1652 01/26/20 2017  NA 138 139  --   K 3.9 3.4*  --   CL 99 102  --   CO2 25 27  --   GLUCOSE 125* 95  --   BUN 8 9  --    CREATININE 0.82 0.92 0.79  CALCIUM 9.6 9.2  --   MG  --  2.3  --   PHOS  --  3.2  --     GFR: CrCl cannot be calculated (Unknown ideal weight.).  Liver Function Tests: Recent Labs  Lab 01/26/20 0907 01/26/20 1652  AST 27 42*  ALT 29 30  ALKPHOS 90 78  BILITOT 0.9 1.2  PROT 8.7* 8.1  ALBUMIN 4.5 4.2    Recent Labs  Lab 01/26/20 0907  LIPASE 34    HbA1C: Recent Labs    01/26/20 1702  HGBA1C 5.1     Lipid Profile: Recent Labs    01/26/20 1652  CHOL 218*  HDL 67  LDLCALC 135*  TRIG 80  CHOLHDL 3.3      Recent Results (from the past 240 hour(s))  Respiratory Panel by RT PCR (Flu A&B, Covid) - Nasopharyngeal Swab     Status: None   Collection Time: 01/26/20  2:30 PM   Specimen: Nasopharyngeal Swab  Result Value Ref Range Status   SARS Coronavirus 2 by RT PCR NEGATIVE NEGATIVE Final    Comment: (NOTE) SARS-CoV-2 target nucleic acids are NOT DETECTED.  The SARS-CoV-2 RNA is generally detectable in upper respiratoy specimens during the acute phase of infection. The lowest concentration of SARS-CoV-2 viral copies this assay can detect is 131 copies/mL. A negative result does not preclude SARS-Cov-2 infection and should not be used as the sole basis for treatment or other patient management decisions. A negative result may occur with  improper specimen collection/handling, submission of specimen other than nasopharyngeal swab, presence of viral mutation(s) within the areas targeted by this assay, and inadequate number of viral copies (<131 copies/mL). A negative result must be combined with clinical observations, patient history, and epidemiological information. The expected result is Negative.  Fact Sheet for Patients:  https://www.moore.com/  Fact Sheet for Healthcare Providers:  https://www.young.biz/  This test is no t yet approved or cleared by the [place] FDA and  has been authorized for detection  and/or diagnosis of SARS-CoV-2 by FDA under an Emergency Use Authorization (EUA). This EUA will remain  in effect (meaning this test can be used) for the duration of the COVID-19 declaration under Section 564(b)(1) of the Act, 21 U.S.C. section 360bbb-3(b)(1), unless the authorization is terminated or revoked sooner.     Influenza A by PCR NEGATIVE NEGATIVE Final   Influenza B by PCR NEGATIVE NEGATIVE Final    Comment: (NOTE) The Xpert Xpress SARS-CoV-2/FLU/RSV assay is intended as an aid in  the diagnosis of influenza from Nasopharyngeal swab specimens and  should not be used as a sole basis for  treatment. Nasal washings and  aspirates are unacceptable for Xpert Xpress SARS-CoV-2/FLU/RSV  testing.  Fact Sheet for Patients: https://www.moore.com/  Fact Sheet for Healthcare Providers: https://www.young.biz/  This test is not yet approved or cleared by the [place] FDA and  has been authorized for detection and/or diagnosis of SARS-CoV-2 by  FDA under an Emergency Use Authorization (EUA). This EUA will remain  in effect (meaning this test can be used) for the duration of the  Covid-19 declaration under Section 564(b)(1) of the Act, 21  U.S.C. section 360bbb-3(b)(1), unless the authorization is  terminated or revoked. Performed at [facility], [address], [place], [place] [address]       Radiology Studies: DG Chest 2 View  Result Date: 01/26/2020 CLINICAL DATA:  Chest pain. EXAM: CHEST - 2 VIEW COMPARISON:  None. FINDINGS: The heart size and mediastinal contours are within normal limits. Both lungs are clear. No pneumothorax or pleural effusion is noted. The visualized skeletal structures are unremarkable. IMPRESSION: No active cardiopulmonary disease. Electronically Signed   By: Lupita Raider M.D.   On: 01/26/2020 09:43       LOS: 0 days   Egidio Lofgren Rito Ehrlich  [facility] Pager on  [email or web address]  01/27/2020, 11:46 AM

## 2020-01-27 NOTE — Discharge Instructions (Signed)

## 2020-01-27 NOTE — Progress Notes (Signed)
Results relayed to patient via phone. F/u arranged and placed on AVS (12/8).

## 2020-01-27 NOTE — Progress Notes (Signed)
  Echocardiogram 2D Echocardiogram has been performed.  Derrick Meyer 01/27/2020, 8:50 AM

## 2020-01-27 NOTE — ED Notes (Signed)
Pt came back from [facility] slightly agitated. This nurse offered medication for anxiety but pt declined. Pt stated "I am tired of being poked and prodded and am ready to go". This nurse asked if he intends to leave AMA and pt said yes. This nurse told him this visit would not be covered by insurance if he left. Obtaining VS now. Sent a message to PA to notify of the situation

## 2020-01-27 NOTE — Progress Notes (Signed)
Brief cardiology update:  CT read per Dr. Flora Lipps IMPRESSION: 1. Coronary calcium score of 1.5. This was 66th percentile for age and sex matched controls.  2. Normal coronary origin with right dominance.  3. 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  RECOMMENDATIONS: 1. Minimal non-obstructive CAD (0-24%). Consider non-atherosclerotic causes of chest pain. Consider preventive therapy and risk factor Modification.  Recommend aspirin, statin, outpatient cardiology follow up. Alerted Dr. Rito Ehrlich to findings. Ok to discharge from cardiac standpoint.  Jodelle Red, MD, PhD [facility]  [address], [address] [place], [place] [address] [phone]

## 2020-01-27 NOTE — ED Notes (Signed)
Pt is aware of CT angio and 18 gauge IV is in the left [facility]

## 2020-01-27 NOTE — Discharge Summary (Signed)
[facility]  Physician Discharge Summary   Patient ID: THEDFORD BUNTON MRN: [id number] DOB/AGE: [id number] 48 y.o.  Admit date: 01/26/2020 Discharge date: 01/28/2020  PCP: Pcp, No  DISCHARGE DIAGNOSES:  Atypical chest pain Nonspecific EKG changes Alcohol abuse History of anxiety Essential hypertension Dyslipidemia  RECOMMENDATIONS FOR OUTPATIENT FOLLOW UP: 1. Cardiology to arrange outpatient follow-up   Home Health: None Equipment/Devices: None  CODE STATUS: Full code  DISCHARGE CONDITION: fair  Diet recommendation: Heart healthy  INITIAL HISTORY: 49 year old male with past medical history of anxiety, alcohol abuse, tobacco use who presented to the ED on 11/10 with sudden onset left-sided chest pain with left arm weakness and shortness of breath.  Found to have nonspecific changes on EKG with minimally elevated troponin.  Was hospitalized for further management.  Consultations:  [facility] cardiology  Procedures: Echocardiogram.  See results below.   HOSPITAL COURSE:   Chest pain with nonspecific EKG changes Patient noted to have minimal troponin elevation. EKG showed some ST depression in the lateral leads.  Patient apparently with previous history of cocaine abuse.  Urine drug screen done during this admission was negative for cocaine.  Echocardiogram showed normal systolic function.  No regional wall motion abnormalities were noted.  Grade 1 diastolic dysfunction was noted.  Cardiology was consulted.  Patient underwent coronary CT angiogram which showed minimal nonobstructive CAD.  Aspirin recommended.  LDL was noted to be 135.  Patient will be discharged on statin.  Essential hypertension Patient presented with elevated blood pressures.  Started on carvedilol with improvement.  History of anxiety Patient apparently has been self-medicating.    History of alcohol abuse Drinks 6-8 beers every other day.  Last drink was about 2 days ago.  No evidence for  withdrawal symptoms noted.  Patient counseled to stop drinking alcohol.  History of tobacco abuse Cessation counseling was provided.  After he underwent the coronary CT angiogram patient was threatening to leave AGAINST MEDICAL ADVICE.  He said he wanted to go home immediately.  Discussed with cardiology.  Patient was subsequently discharged home.   PERTINENT LABS:  The results of significant diagnostics from this hospitalization (including imaging, microbiology, ancillary and laboratory) are listed below for reference.    Microbiology: Recent Results (from the past 240 hour(s))  Respiratory Panel by RT PCR (Flu A&B, Covid) - Nasopharyngeal Swab     Status: None   Collection Time: 01/26/20  2:30 PM   Specimen: Nasopharyngeal Swab  Result Value Ref Range Status   SARS Coronavirus 2 by RT PCR NEGATIVE NEGATIVE Final    Comment: (NOTE) SARS-CoV-2 target nucleic acids are NOT DETECTED.  The SARS-CoV-2 RNA is generally detectable in upper respiratoy specimens during the acute phase of infection. The lowest concentration of SARS-CoV-2 viral copies this assay can detect is 131 copies/mL. A negative result does not preclude SARS-Cov-2 infection and should not be used as the sole basis for treatment or other patient management decisions. A negative result may occur with  improper specimen collection/handling, submission of specimen other than nasopharyngeal swab, presence of viral mutation(s) within the areas targeted by this assay, and inadequate number of viral copies (<131 copies/mL). A negative result must be combined with clinical observations, patient history, and epidemiological information. The expected result is Negative.  Fact Sheet for Patients:  https://www.moore.com/  Fact Sheet for Healthcare Providers:  https://www.young.biz/  This test is no t yet approved or cleared by the [place] FDA and  has been authorized for  detection and/or diagnosis of SARS-CoV-2  by FDA under an Emergency Use Authorization (EUA). This EUA will remain  in effect (meaning this test can be used) for the duration of the COVID-19 declaration under Section 564(b)(1) of the Act, 21 U.S.C. section 360bbb-3(b)(1), unless the authorization is terminated or revoked sooner.     Influenza A by PCR NEGATIVE NEGATIVE Final   Influenza B by PCR NEGATIVE NEGATIVE Final    Comment: (NOTE) The Xpert Xpress SARS-CoV-2/FLU/RSV assay is intended as an aid in  the diagnosis of influenza from Nasopharyngeal swab specimens and  should not be used as a sole basis for treatment. Nasal washings and  aspirates are unacceptable for Xpert Xpress SARS-CoV-2/FLU/RSV  testing.  Fact Sheet for Patients: https://www.moore.com/  Fact Sheet for Healthcare Providers: https://www.young.biz/  This test is not yet approved or cleared by the [place] FDA and  has been authorized for detection and/or diagnosis of SARS-CoV-2 by  FDA under an Emergency Use Authorization (EUA). This EUA will remain  in effect (meaning this test can be used) for the duration of the  Covid-19 declaration under Section 564(b)(1) of the Act, 21  U.S.C. section 360bbb-3(b)(1), unless the authorization is  terminated or revoked. Performed at [facility], [address], [place], [place] [address]      Labs:    Basic Metabolic Panel: Recent Labs  Lab 01/26/20 0907 01/26/20 1652 01/26/20 2017  NA 138 139  --   K 3.9 3.4*  --   CL 99 102  --   CO2 25 27  --   GLUCOSE 125* 95  --   BUN 8 9  --   CREATININE 0.82 0.92 0.79  CALCIUM 9.6 9.2  --   MG  --  2.3  --   PHOS  --  3.2  --    Liver Function Tests: Recent Labs  Lab 01/26/20 0907 01/26/20 1652  AST 27 42*  ALT 29 30  ALKPHOS 90 78  BILITOT 0.9 1.2  PROT 8.7* 8.1  ALBUMIN 4.5 4.2   Recent Labs  Lab 01/26/20 0907  LIPASE 34   CBC: Recent  Labs  Lab 01/26/20 0907 01/26/20 1652 01/26/20 2017  WBC 8.9 8.0 8.8  HGB 16.4 16.0 15.8  HCT 47.5 47.0 45.8  MCV 97.7 98.9 98.3  PLT 285 270 277     IMAGING STUDIES DG Chest 2 View  Result Date: 01/26/2020 CLINICAL DATA:  Chest pain. EXAM: CHEST - 2 VIEW COMPARISON:  None. FINDINGS: The heart size and mediastinal contours are within normal limits. Both lungs are clear. No pneumothorax or pleural effusion is noted. The visualized skeletal structures are unremarkable. IMPRESSION: No active cardiopulmonary disease. Electronically Signed   By: Lupita Raider M.D.   On: 01/26/2020 09:43   CT CORONARY MORPH W/CTA COR W/SCORE W/CA W/CM &/OR WO/CM  Addendum Date: 01/27/2020   ADDENDUM REPORT: 01/27/2020 13:54 CLINICAL DATA:  Chest pain EXAM: Cardiac/Coronary CTA TECHNIQUE: The patient was scanned on a [employer]. A 100 kV prospective scan was triggered in the descending thoracic aorta at 111 HU's. Axial non-contrast 3 mm slices were carried out through the heart. The data set was analyzed on a dedicated work station and scored using the Agatson method. Gantry rotation speed was 250 msecs and collimation was .6 mm. No beta blockade and 0.8 mg of sl NTG was given. The 3D data set was reconstructed in 5% intervals of the 35-75 % of the R-R cycle. Diastolic phases were analyzed on a dedicated  work station using MPR, MIP and VRT modes. The patient received 80 cc of contrast. FINDINGS: Image quality: Excellent. Noise artifact is: Limited. Coronary Arteries:  Normal coronary origin.  Right dominance. Left main: The left main is a large caliber vessel with a normal take off from the left coronary cusp that bifurcates to form a left anterior descending artery and a left circumflex artery. There is no plaque or stenosis. Left anterior descending artery: The LAD is patent without plaque or stenosis. The LAD is a short vessel and much of the septum is supplied by a large second diagonal branch. The  first diagonal is a large branch that supplies most of the anterolateral wall and contains minimal non-calcified plaque (<25%). The second diagonal branch is a large vessel that supplies the apical septum, and apex, and contains minimal mixed density plaque (<25%). Left circumflex artery: The LCX is non-dominant and contains minimal non-calcified plaque (<25%). The LCX gives off 1 patent obtuse marginal branch and 1 patent PLV branch. Right coronary artery: The RCA is dominant with normal take off from the right coronary cusp. There is no evidence of plaque or stenosis. The RCA terminates as a PDA and without evidence of plaque or stenosis. Right Atrium: Right atrial size is within normal limits. Right Ventricle: The right ventricular cavity is within normal limits. Left Atrium: Left atrial size is normal in size with no left atrial appendage filling defect. Left Ventricle: The ventricular cavity size is within normal limits. There are no stigmata of prior infarction. There is no abnormal filling defect. Pulmonary arteries: Normal in size without proximal filling defect. Pulmonary veins: Normal pulmonary venous drainage. Pericardium: Normal thickness with no significant effusion or calcium present. Cardiac valves: The aortic valve is trileaflet without significant calcification. The mitral valve is normal structure without significant calcification. Aorta: Normal caliber with no significant disease. Extra-cardiac findings: See attached radiology report for non-cardiac structures. IMPRESSION: 1. Coronary calcium score of 1.5. This was 66th percentile for age and sex matched controls. 2. Normal coronary origin with right dominance. 3. 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 RECOMMENDATIONS: 1. Minimal non-obstructive CAD (0-24%). Consider non-atherosclerotic causes of chest pain. Consider preventive therapy and risk factor modification. Lennie Odor, MD Electronically Signed   By: Lennie Odor   On: 01/27/2020 13:54    Result Date: 01/27/2020 EXAM: OVER-READ INTERPRETATION CT CHEST The following report is an over-read performed by radiologist Dr. Hulan Saas of [facility] on 01/27/2020. This over-read does not include interpretation of cardiac or coronary anatomy or pathology. The coronary calcium score/coronary CTA interpretation by the cardiologist is attached. COMPARISON:  None. FINDINGS: Vascular: No visible atherosclerosis and no evidence of aneurysm involving the visualized aorta. Mediastinum/Nodes: No pathologic lymphadenopathy within the visualized mediastinum. Visualized esophagus normal in appearance. Lungs/Pleura: Visualized lung parenchyma clear. Central bronchi patent without significant bronchial wall thickening. No pleural effusions. Upper Abdomen: Visualized extreme upper abdomen unremarkable. Musculoskeletal: Visualized bony thorax unremarkable. IMPRESSION: No significant extracardiac findings. Electronically Signed: By: Hulan Saas M.D. On: 01/27/2020 13:22   ECHOCARDIOGRAM COMPLETE  Result Date: 01/27/2020    ECHOCARDIOGRAM REPORT   Patient Name:   Derrick Meyer Shere Date of Exam: 01/27/2020 Medical Rec #:  [id number]       Height: Accession #:    [id number]      Weight: Date of Birth:  09-May-1970       BSA: Patient Age:    49 years        BP:  110/70 mmHg Patient Gender: M               HR:           68 bpm. Exam Location:  Inpatient Procedure: 2D Echo, Cardiac Doppler and Color Doppler Indications:    Chest pain  History:        Patient has no prior history of Echocardiogram examinations.                 Signs/Symptoms:Chest Pain and Shortness of Breath; Risk                 Factors:Hypertension and Current Smoker.  Sonographer:    Lavenia AtlasBrooke Strickland Referring Phys: [id number] JARED E SEGAL IMPRESSIONS  1. Left ventricular ejection fraction, by estimation, is 55 to 60%. The left ventricle has normal function. The left ventricle has no regional wall motion abnormalities. Left  ventricular diastolic parameters are consistent with Grade I diastolic dysfunction (impaired relaxation).  2. Right ventricular systolic function is normal. The right ventricular size is normal. Tricuspid regurgitation signal is inadequate for assessing PA pressure.  3. The mitral valve is normal in structure. No evidence of mitral valve regurgitation. No evidence of mitral stenosis.  4. The aortic valve is tricuspid. Aortic valve regurgitation is not visualized. No aortic stenosis is present.  5. The inferior vena cava is normal in size with greater than 50% respiratory variability, suggesting right atrial pressure of 3 mmHg. FINDINGS  Left Ventricle: Left ventricular ejection fraction, by estimation, is 55 to 60%. The left ventricle has normal function. The left ventricle has no regional wall motion abnormalities. The left ventricular internal cavity size was normal in size. There is  no left ventricular hypertrophy. Left ventricular diastolic parameters are consistent with Grade I diastolic dysfunction (impaired relaxation). Right Ventricle: The right ventricular size is normal. No increase in right ventricular wall thickness. Right ventricular systolic function is normal. Tricuspid regurgitation signal is inadequate for assessing PA pressure. Left Atrium: Left atrial size was normal in size. Right Atrium: Right atrial size was normal in size. Pericardium: There is no evidence of pericardial effusion. Mitral Valve: The mitral valve is normal in structure. No evidence of mitral valve regurgitation. No evidence of mitral valve stenosis. Tricuspid Valve: The tricuspid valve is normal in structure. Tricuspid valve regurgitation is not demonstrated. Aortic Valve: The aortic valve is tricuspid. Aortic valve regurgitation is not visualized. No aortic stenosis is present. Pulmonic Valve: The pulmonic valve was normal in structure. Pulmonic valve regurgitation is not visualized. Aorta: The aortic root is normal in size  and structure. Venous: The inferior vena cava is normal in size with greater than 50% respiratory variability, suggesting right atrial pressure of 3 mmHg. IAS/Shunts: No atrial level shunt detected by color flow Doppler.  LEFT VENTRICLE PLAX 2D LVIDd:         4.30 cm  Diastology LVIDs:         2.80 cm  LV e' medial:    6.64 cm/s LV PW:         1.20 cm  LV E/e' medial:  6.6 LV IVS:        1.10 cm  LV e' lateral:   7.83 cm/s LVOT diam:     2.20 cm  LV E/e' lateral: 5.6 LV SV:         62 LVOT Area:     3.80 cm  RIGHT VENTRICLE RV Basal diam:  3.10 cm RV S prime:     10.40  cm/s TAPSE (M-mode): 2.0 cm LEFT ATRIUM             RIGHT ATRIUM LA diam:        3.30 cm RA Area:     10.20 cm LA Vol (A2C):   32.5 ml RA Volume:   20.90 ml LA Vol (A4C):   37.3 ml LA Biplane Vol: 36.2 ml  AORTIC VALVE LVOT Vmax:   89.40 cm/s LVOT Vmean:  59.800 cm/s LVOT VTI:    0.163 m  AORTA Ao Root diam: 3.10 cm MITRAL VALVE MV Area (PHT): 3.12 cm    SHUNTS MV Decel Time: 243 msec    Systemic VTI:  0.16 m MV E velocity: 44.10 cm/s  Systemic Diam: 2.20 cm MV A velocity: 52.70 cm/s MV E/A ratio:  0.84 Marca Ancona MD Electronically signed by Marca Ancona MD Signature Date/Time: 01/27/2020/2:01:58 PM    Final     DISCHARGE EXAMINATION: Vitals:   01/27/20 1200 01/27/20 1339 01/27/20 1346 01/27/20 1413  BP: (!) 128/92 (!) 132/92 (!) 132/92 (!) 139/101  Pulse: (!) 57 97 97 61  Resp: Temp:    (!) 97.5 F (36.4 C)  TempSrc:    Oral  SpO2: 99% 95%  99%   See progress note from earlier today.  DISPOSITION: Home  Discharge Instructions    Call MD for:  difficulty breathing, headache or visual disturbances   Complete by: As directed    Call MD for:  extreme fatigue   Complete by: As directed    Call MD for:  persistant dizziness or light-headedness   Complete by: As directed    Call MD for:  persistant nausea and vomiting   Complete by: As directed    Call MD for:  severe uncontrolled pain   Complete by: As  directed    Call MD for:  temperature >100.4   Complete by: As directed    Diet - low sodium heart healthy   Complete by: As directed    Discharge instructions   Complete by: As directed    Please take your medications as prescribed. Please follow up with your PCP. Cardiologist will arrange to have you see them in their office. Please stop alcohol, tobacco and other recreational drug use.  You were cared for by a hospitalist during your hospital stay. If you have any questions about your discharge medications or the care you received while you were in the hospital after you are discharged, you can call the unit and asked to speak with the hospitalist on call if the hospitalist that took care of you is not available. Once you are discharged, your primary care physician will handle any further medical issues. Please note that NO REFILLS for any discharge medications will be authorized once you are discharged, as it is imperative that you return to your primary care physician (or establish a relationship with a primary care physician if you do not have one) for your aftercare needs so that they can reassess your need for medications and monitor your lab values. If you do not have a primary care physician, you can call [phone] for a physician referral.   Increase activity slowly   Complete by: As directed         Allergies as of 01/27/2020   No Known Allergies     Medication List    TAKE these medications   aspirin 81 MG EC tablet Take 1 tablet (81 mg total) by mouth daily.  Swallow whole.   atorvastatin 20 MG tablet Commonly known as: LIPITOR Take 1 tablet (20 mg total) by mouth daily.   calcium carbonate 750 MG chewable tablet Commonly known as: TUMS EX Chew 2 tablets by mouth as needed for heartburn.   carvedilol 6.25 MG tablet Commonly known as: COREG Take 1 tablet (6.25 mg total) by mouth 2 (two) times daily with a meal.   cetirizine 10 MG tablet Commonly known as:  ZYRTEC Take 10 mg by mouth daily as needed for allergies.   thiamine 100 MG tablet Take 1 tablet (100 mg total) by mouth daily.         Follow-up Information    Jodelle Red, MD Follow up.   Specialty: Cardiology Why: [facility] (Cardiologist) - [place] location - a follow-up has been scheduled for you on Wednesday December 8th at 10:20 AM (please arrive by 10:05 AM).  Contact information: [address] [place] [address] [phone]               TOTAL DISCHARGE TIME: 35 minutes  Mikyla Schachter Rito Ehrlich  [facility] Pager on [email or web address]  01/28/2020, 1:20 PM

## 2020-01-27 NOTE — ED Notes (Signed)
Patient transported to [facility] for scan/ Patient will be transported back to [facility]

## 2020-02-23 ENCOUNTER — Ambulatory Visit: Payer: 59 | Admitting: Cardiology

## 2021-06-05 IMAGING — CT CT HEART MORP W/ CTA COR W/ SCORE W/ CA W/CM &/OR W/O CM
3 of 6 series · 9 of 20 positions shown, 10 images · IV contrast (APPLIED)
Comparison: None.
COMPARISON: None.

Addendum:
EXAM:
OVER-READ INTERPRETATION CT CHEST

The following report is an over-read performed by radiologist Dr.
over-read does not include interpretation of cardiac or coronary
anatomy or pathology. The coronary calcium score/coronary CTA
interpretation by the cardiologist is attached.
CLINICAL DATA: Chest pain
Cardiac/Coronary CTA
TECHNIQUE: The patient was scanned on a [employer] scanner. A 100 kV
prospective scan was triggered in the descending thoracic aorta at
111 HU's. Axial non-contrast 3 mm slices were carried out through
the heart. The data set was analyzed on a dedicated work station and
scored using the Agatson method. Gantry rotation speed was 250 msecs
and collimation was .6 mm. No beta blockade and 0.8 mg of sl NTG was
given. The 3D data set was reconstructed in 5% intervals of the
35-75 % of the R-R cycle. Diastolic phases were analyzed on a
dedicated work station using MPR, MIP and VRT modes. The patient
received 80 cc of contrast.

[Series 6: best diast 74 % · axial · 0.39mm/px · z∈[+1239,+1298]mm · 3 of 296 slices shown]
[im 74/296  vessel]
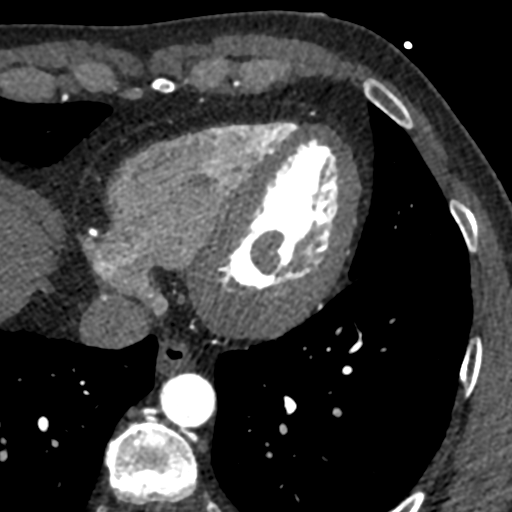
[im 148/296  vessel]
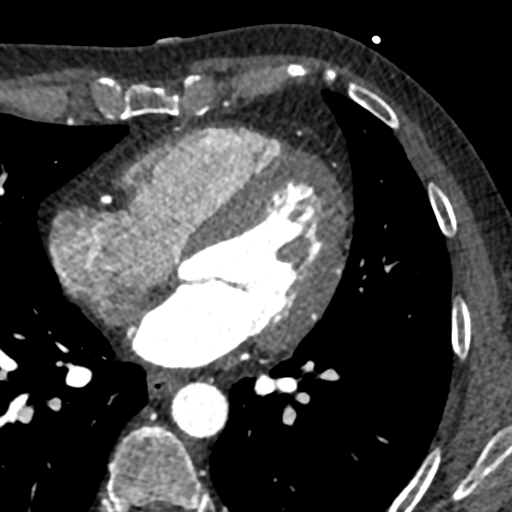
[im 222/296  vessel]
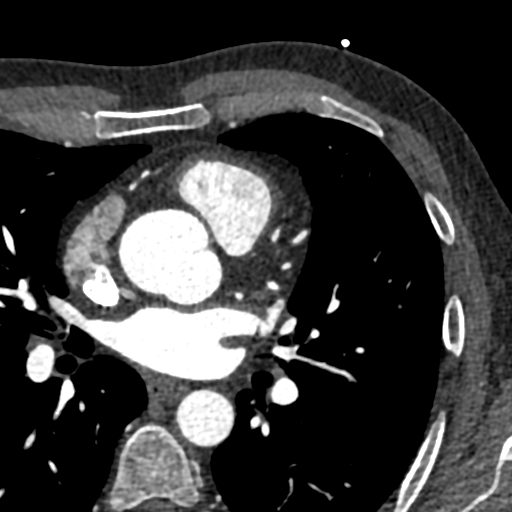

[Series 7: best syst · axial · 0.39mm/px · z∈[+1239,+1298]mm · 3 of 296 slices shown, 4 images]
[im 74/296  vessel]
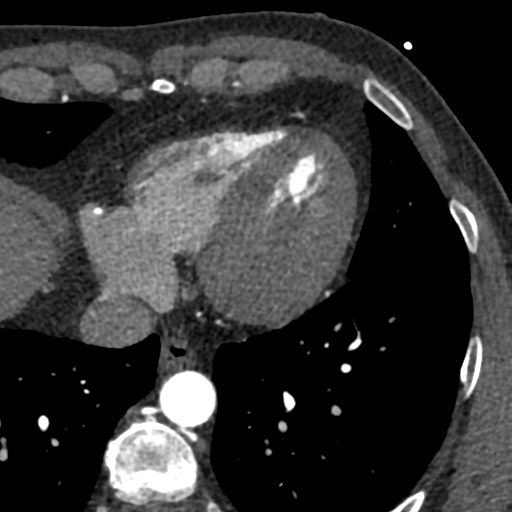
[im 74/296  lung]
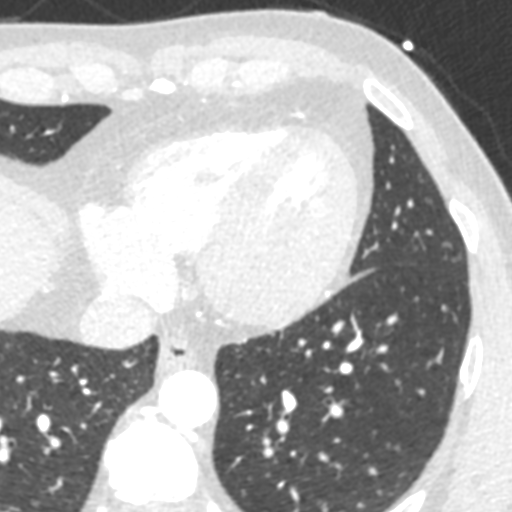
[im 148/296  vessel]
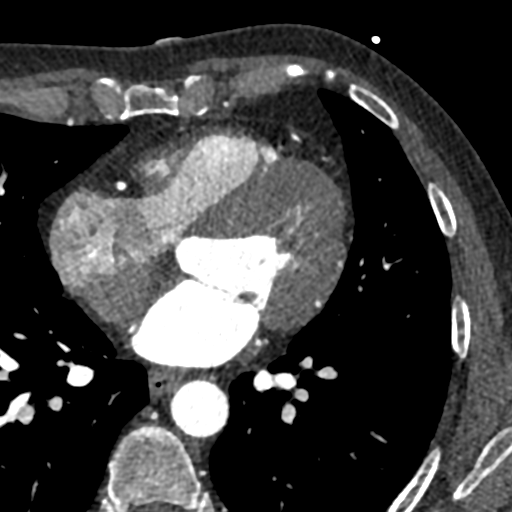
[im 222/296  vessel]
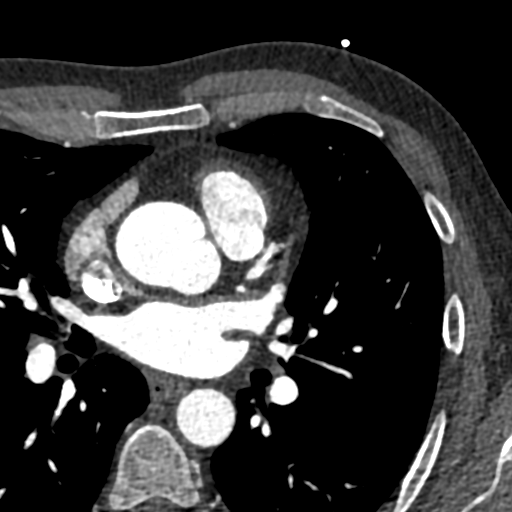

[Series 9: ts syst sharp · axial · 0.39mm/px · z∈[+1239,+1298]mm · 3 of 296 slices shown]
[im 74/296  lung]
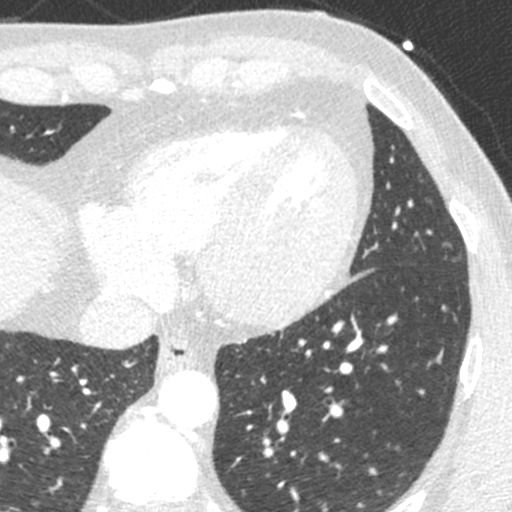
[im 148/296  lung]
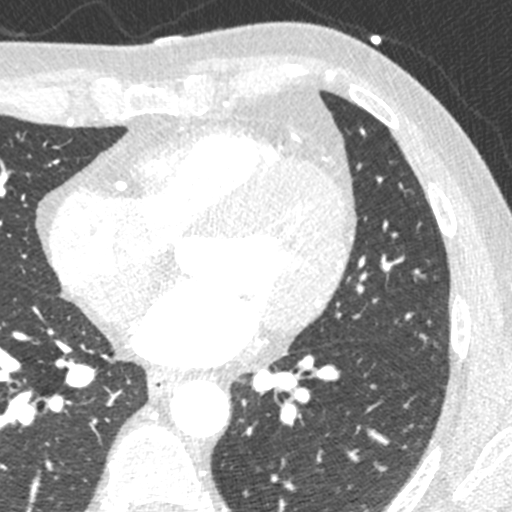
[im 222/296  lung]
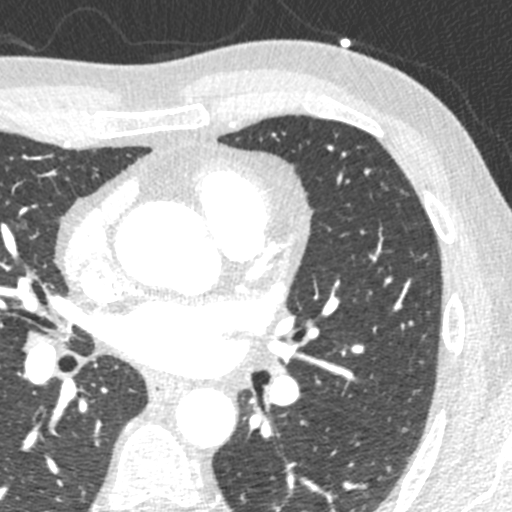

[9 of 20 positions shown; findings below may reference images not displayed]

FINDINGS: Vascular: No visible atherosclerosis and no evidence of aneurysm
involving the visualized aorta.

Mediastinum/Nodes: No pathologic lymphadenopathy within the
visualized mediastinum. Visualized esophagus normal in appearance.

Lungs/Pleura: Visualized lung parenchyma clear. Central bronchi
patent without significant bronchial wall thickening. No pleural
effusions.

Upper Abdomen: Visualized extreme upper abdomen unremarkable.

Musculoskeletal: Visualized bony thorax unremarkable.
IMPRESSION: No significant extracardiac findings.
FINDINGS: Image quality: Excellent.

Noise artifact is: Limited.

Coronary Arteries:  Normal coronary origin.  Right dominance.

Left main: The left main is a large caliber vessel with a normal
take off from the left coronary cusp that bifurcates to form a left
anterior descending artery and a left circumflex artery. There is no
plaque or stenosis.

Left anterior descending artery: The LAD is patent without plaque or
stenosis. The LAD is a short vessel and much of the septum is
supplied by a large second diagonal branch. The first diagonal is a
large branch that supplies most of the anterolateral wall and
contains minimal non-calcified plaque (<25%). The second diagonal
branch is a large vessel that supplies the apical septum, and apex,
and contains minimal mixed density plaque (<25%).

Left circumflex artery: The LCX is non-dominant and contains minimal
non-calcified plaque (<25%). The LCX gives off 1 patent obtuse
marginal branch and 1 patent Riffat Jerez.

Right coronary artery: The RCA is dominant with normal take off from
the right coronary cusp. There is no evidence of plaque or stenosis.
The RCA terminates as a PDA and without evidence of plaque or
stenosis.

Right Atrium: Right atrial size is within normal limits.

Right Ventricle: The right ventricular cavity is within normal
limits.

Left Atrium: Left atrial size is normal in size with no left atrial
appendage filling defect.

Left Ventricle: The ventricular cavity size is within normal limits.
There are no stigmata of prior infarction. There is no abnormal
filling defect.

Pulmonary arteries: Normal in size without proximal filling defect.

Pulmonary veins: Normal pulmonary venous drainage.

Pericardium: Normal thickness with no significant effusion or
calcium present.

Cardiac valves: The aortic valve is trileaflet without significant
calcification. The mitral valve is normal structure without
significant calcification.

Aorta: Normal caliber with no significant disease.

Extra-cardiac findings: See attached radiology report for
non-cardiac structures.
IMPRESSION: 1. Coronary calcium score of 1.5. This was 66th percentile for age
and sex matched controls.

2. Normal coronary origin with right dominance.

3. Minimal CAD (<25%) in the LAD and LCX.

RECOMMENDATIONS:
1. Minimal non-obstructive CAD (0-24%). Consider non-atherosclerotic
causes of chest pain. Consider preventive therapy and risk factor
modification.

*** End of Addendum ***
EXAM:
OVER-READ INTERPRETATION CT CHEST

The following report is an over-read performed by radiologist Dr.
over-read does not include interpretation of cardiac or coronary
anatomy or pathology. The coronary calcium score/coronary CTA
interpretation by the cardiologist is attached.
FINDINGS: Vascular: No visible atherosclerosis and no evidence of aneurysm
involving the visualized aorta.

Mediastinum/Nodes: No pathologic lymphadenopathy within the
visualized mediastinum. Visualized esophagus normal in appearance.

Lungs/Pleura: Visualized lung parenchyma clear. Central bronchi
patent without significant bronchial wall thickening. No pleural
effusions.

Upper Abdomen: Visualized extreme upper abdomen unremarkable.

Musculoskeletal: Visualized bony thorax unremarkable.
IMPRESSION: No significant extracardiac findings.

## 2023-06-02 DIAGNOSIS — I10 Essential (primary) hypertension: Secondary | ICD-10-CM | POA: Diagnosis not present

## 2023-06-02 DIAGNOSIS — J302 Other seasonal allergic rhinitis: Secondary | ICD-10-CM | POA: Diagnosis not present

## 2023-06-02 DIAGNOSIS — G4700 Insomnia, unspecified: Secondary | ICD-10-CM | POA: Diagnosis not present

## 2023-10-20 DIAGNOSIS — R1032 Left lower quadrant pain: Secondary | ICD-10-CM | POA: Diagnosis not present

## 2023-10-20 DIAGNOSIS — Z6823 Body mass index (BMI) 23.0-23.9, adult: Secondary | ICD-10-CM | POA: Diagnosis not present

## 2023-10-24 ENCOUNTER — Other Ambulatory Visit: Payer: Self-pay | Admitting: Family Medicine

## 2023-10-24 DIAGNOSIS — R1032 Left lower quadrant pain: Secondary | ICD-10-CM

## 2023-10-28 ENCOUNTER — Ambulatory Visit

## 2023-10-28 DIAGNOSIS — R1032 Left lower quadrant pain: Secondary | ICD-10-CM

## 2023-10-28 DIAGNOSIS — K5730 Diverticulosis of large intestine without perforation or abscess without bleeding: Secondary | ICD-10-CM | POA: Diagnosis not present

## 2023-10-28 DIAGNOSIS — K429 Umbilical hernia without obstruction or gangrene: Secondary | ICD-10-CM | POA: Diagnosis not present

## 2023-10-28 DIAGNOSIS — K4020 Bilateral inguinal hernia, without obstruction or gangrene, not specified as recurrent: Secondary | ICD-10-CM | POA: Diagnosis not present

## 2023-10-28 MED ORDER — IOHEXOL 300 MG/ML  SOLN
100.0000 mL | Freq: Once | INTRAMUSCULAR | Status: AC | PRN
  Administered 2023-10-28 (×2): 100 mL via INTRAVENOUS

## 2023-11-25 DIAGNOSIS — J302 Other seasonal allergic rhinitis: Secondary | ICD-10-CM | POA: Diagnosis not present

## 2023-11-25 DIAGNOSIS — G4700 Insomnia, unspecified: Secondary | ICD-10-CM | POA: Diagnosis not present

## 2023-11-25 DIAGNOSIS — I10 Essential (primary) hypertension: Secondary | ICD-10-CM | POA: Diagnosis not present

## 2023-11-25 DIAGNOSIS — R1032 Left lower quadrant pain: Secondary | ICD-10-CM | POA: Diagnosis not present

## 2023-11-25 DIAGNOSIS — Z6823 Body mass index (BMI) 23.0-23.9, adult: Secondary | ICD-10-CM | POA: Diagnosis not present

## 2023-12-25 DIAGNOSIS — K649 Unspecified hemorrhoids: Secondary | ICD-10-CM | POA: Diagnosis not present

## 2023-12-25 DIAGNOSIS — Z1211 Encounter for screening for malignant neoplasm of colon: Secondary | ICD-10-CM | POA: Diagnosis not present
